# Patient Record
Sex: Male | Born: 1980 | Race: White | Hispanic: No | Marital: Married | Smoking: Never smoker
Health system: Southern US, Community
[De-identification: ages and names within clinical notes are randomized; demographics above are authoritative.]

## PROBLEM LIST (undated history)

## (undated) DIAGNOSIS — J45909 Unspecified asthma, uncomplicated: Secondary | ICD-10-CM

## (undated) DIAGNOSIS — J302 Other seasonal allergic rhinitis: Secondary | ICD-10-CM

## (undated) DIAGNOSIS — I10 Essential (primary) hypertension: Secondary | ICD-10-CM

---

## 2016-09-26 ENCOUNTER — Encounter: Payer: Self-pay | Admitting: *Deleted

## 2016-09-26 ENCOUNTER — Emergency Department (INDEPENDENT_AMBULATORY_CARE_PROVIDER_SITE_OTHER): Payer: Managed Care, Other (non HMO)

## 2016-09-26 ENCOUNTER — Emergency Department
Admission: EM | Admit: 2016-09-26 | Discharge: 2016-09-26 | Disposition: A | Discharge status: Home / Self Care | Payer: Managed Care, Other (non HMO) | Source: Home / Self Care | Attending: Family Medicine | Admitting: Family Medicine

## 2016-09-26 DIAGNOSIS — M25562 Pain in left knee: Secondary | ICD-10-CM

## 2016-09-26 DIAGNOSIS — M25561 Pain in right knee: Secondary | ICD-10-CM

## 2016-09-26 DIAGNOSIS — S62235A Other nondisplaced fracture of base of first metacarpal bone, left hand, initial encounter for closed fracture: Secondary | ICD-10-CM

## 2016-09-26 DIAGNOSIS — S8002XA Contusion of left knee, initial encounter: Secondary | ICD-10-CM | POA: Diagnosis not present

## 2016-09-26 DIAGNOSIS — S62232A Other displaced fracture of base of first metacarpal bone, left hand, initial encounter for closed fracture: Secondary | ICD-10-CM

## 2016-09-26 DIAGNOSIS — S62212A Bennett's fracture, left hand, initial encounter for closed fracture: Secondary | ICD-10-CM | POA: Insufficient documentation

## 2016-09-26 DIAGNOSIS — T07XXXA Unspecified multiple injuries, initial encounter: Secondary | ICD-10-CM

## 2016-09-26 HISTORY — DX: Unspecified asthma, uncomplicated: J45.909

## 2016-09-26 HISTORY — DX: Essential (primary) hypertension: I10

## 2016-09-26 HISTORY — DX: Other seasonal allergic rhinitis: J30.2

## 2016-09-26 MED ORDER — IBUPROFEN 600 MG PO TABS
600.0000 mg | ORAL_TABLET | Freq: Once | ORAL | Status: AC
  Administered 2016-09-26: 600 mg via ORAL
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
## 2016-09-26 NOTE — Discharge Instructions (Signed)
  You may take 500mg acetaminophen every 4-6 hours or in combination with ibuprofen 400-600mg every 6-8 hours as needed for pain and inflammation.  

## 2016-09-26 NOTE — Assessment & Plan Note (Signed)
Reaction knee brace. Over-the-counter analgesics as desired. No working until I clear him.

## 2016-09-26 NOTE — Assessment & Plan Note (Addendum)
Fracture is intra-articular, minimally displaced, this is on his left hand, because surgery is not going to be an option we are not going to proceed with CT, we will simply treat this conservatively with EXOS cast.  I did discuss the need to determine if there was significant intra-articular step-off but he tells me that he would never want an operation so CT would not be necessary. Return in 2 weeks.
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
## 2016-10-11 ENCOUNTER — Ambulatory Visit (INDEPENDENT_AMBULATORY_CARE_PROVIDER_SITE_OTHER): Payer: Managed Care, Other (non HMO)

## 2016-10-11 ENCOUNTER — Ambulatory Visit (INDEPENDENT_AMBULATORY_CARE_PROVIDER_SITE_OTHER): Payer: Managed Care, Other (non HMO) | Admitting: Sports Medicine

## 2016-10-11 ENCOUNTER — Encounter: Payer: Self-pay | Admitting: Sports Medicine

## 2016-10-11 DIAGNOSIS — W19XXXD Unspecified fall, subsequent encounter: Secondary | ICD-10-CM | POA: Diagnosis not present

## 2016-10-11 DIAGNOSIS — S8002XA Contusion of left knee, initial encounter: Secondary | ICD-10-CM | POA: Diagnosis not present

## 2016-10-11 DIAGNOSIS — S62212D Bennett's fracture, left hand, subsequent encounter for fracture with routine healing: Secondary | ICD-10-CM | POA: Diagnosis not present

## 2016-10-11 DIAGNOSIS — S62512G Displaced fracture of proximal phalanx of left thumb, subsequent encounter for fracture with delayed healing: Secondary | ICD-10-CM

## 2016-10-11 NOTE — Assessment & Plan Note (Signed)
Essentially resolved, may start lower body exercises in the gym, cardio.
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
## 2016-10-11 NOTE — Assessment & Plan Note (Signed)
2 weeks post Bennett fracture of the base of the metacarpal bone of the left thumb. Doing well in EXOS cast. Repeating x-rays today. Fracture was essentially nondisplaced, patient did decline CT scan initially to determine if there was any minor displacement. Expected total of 6 weeks, meaning 4 more weeks of cast immobilization.

## 2016-11-08 ENCOUNTER — Encounter: Payer: Self-pay | Admitting: Sports Medicine

## 2016-11-08 ENCOUNTER — Ambulatory Visit (INDEPENDENT_AMBULATORY_CARE_PROVIDER_SITE_OTHER): Payer: Managed Care, Other (non HMO)

## 2016-11-08 ENCOUNTER — Ambulatory Visit (INDEPENDENT_AMBULATORY_CARE_PROVIDER_SITE_OTHER): Payer: Managed Care, Other (non HMO) | Admitting: Sports Medicine

## 2016-11-08 DIAGNOSIS — S62212D Bennett's fracture, left hand, subsequent encounter for fracture with routine healing: Secondary | ICD-10-CM

## 2016-11-08 NOTE — Assessment & Plan Note (Signed)
6 weeks post Bennett's fracture of the base of the metacarpal bone of the left thumb. Exos cast removed today. Final repeat of x-rays today. Rehabilitation exercises given.

## 2016-11-08 NOTE — Progress Notes (Signed)
  Subjective: 6 weeks post Bennett's fracture, doing well in a cast.   Objective: General: Well-developed, well-nourished, and in no acute distress. Left hand: No pain at the base of the thumb, good motion, good strength.  Assessment/plan:   Bennett's fracture of base of metacarpal bone of left thumb 6 weeks post Bennett's fracture of the base of the metacarpal bone of the left thumb. Exos cast removed today. Final repeat of x-rays today. Rehabilitation exercises given.  ___________________________________________ Ihor Austinhomas J. Benjamin Stainhekkekandam, M.D., ABFM., CAQSM. Primary Care and Sports Medicine [facility]  Adjunct Instructor of Family Medicine  [employer]
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
